# Patient Record
Sex: Male | Born: 1990
Health system: Southern US, Community
[De-identification: ages and names within clinical notes are randomized; demographics above are authoritative.]

## PROBLEM LIST (undated history)

## (undated) DIAGNOSIS — E782 Mixed hyperlipidemia: Secondary | ICD-10-CM

## (undated) DIAGNOSIS — J45909 Unspecified asthma, uncomplicated: Secondary | ICD-10-CM

## (undated) DIAGNOSIS — B360 Pityriasis versicolor: Secondary | ICD-10-CM

## (undated) HISTORY — DX: Pityriasis versicolor: B36.0

## (undated) HISTORY — DX: Unspecified asthma, uncomplicated: J45.909

## (undated) HISTORY — DX: Mixed hyperlipidemia: E78.2

---
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
## 2009-01-20 IMAGING — CR DG HAND COMPLETE 3+V*R*
3 series · 3 of 3 positions shown · non-contrast
Comparison: None

CLINICAL DATA: Hip somewhat today now with right hand pain

RIGHT HAND - COMPLETE 3+ VIEW

[view not recorded (1 of 3)]
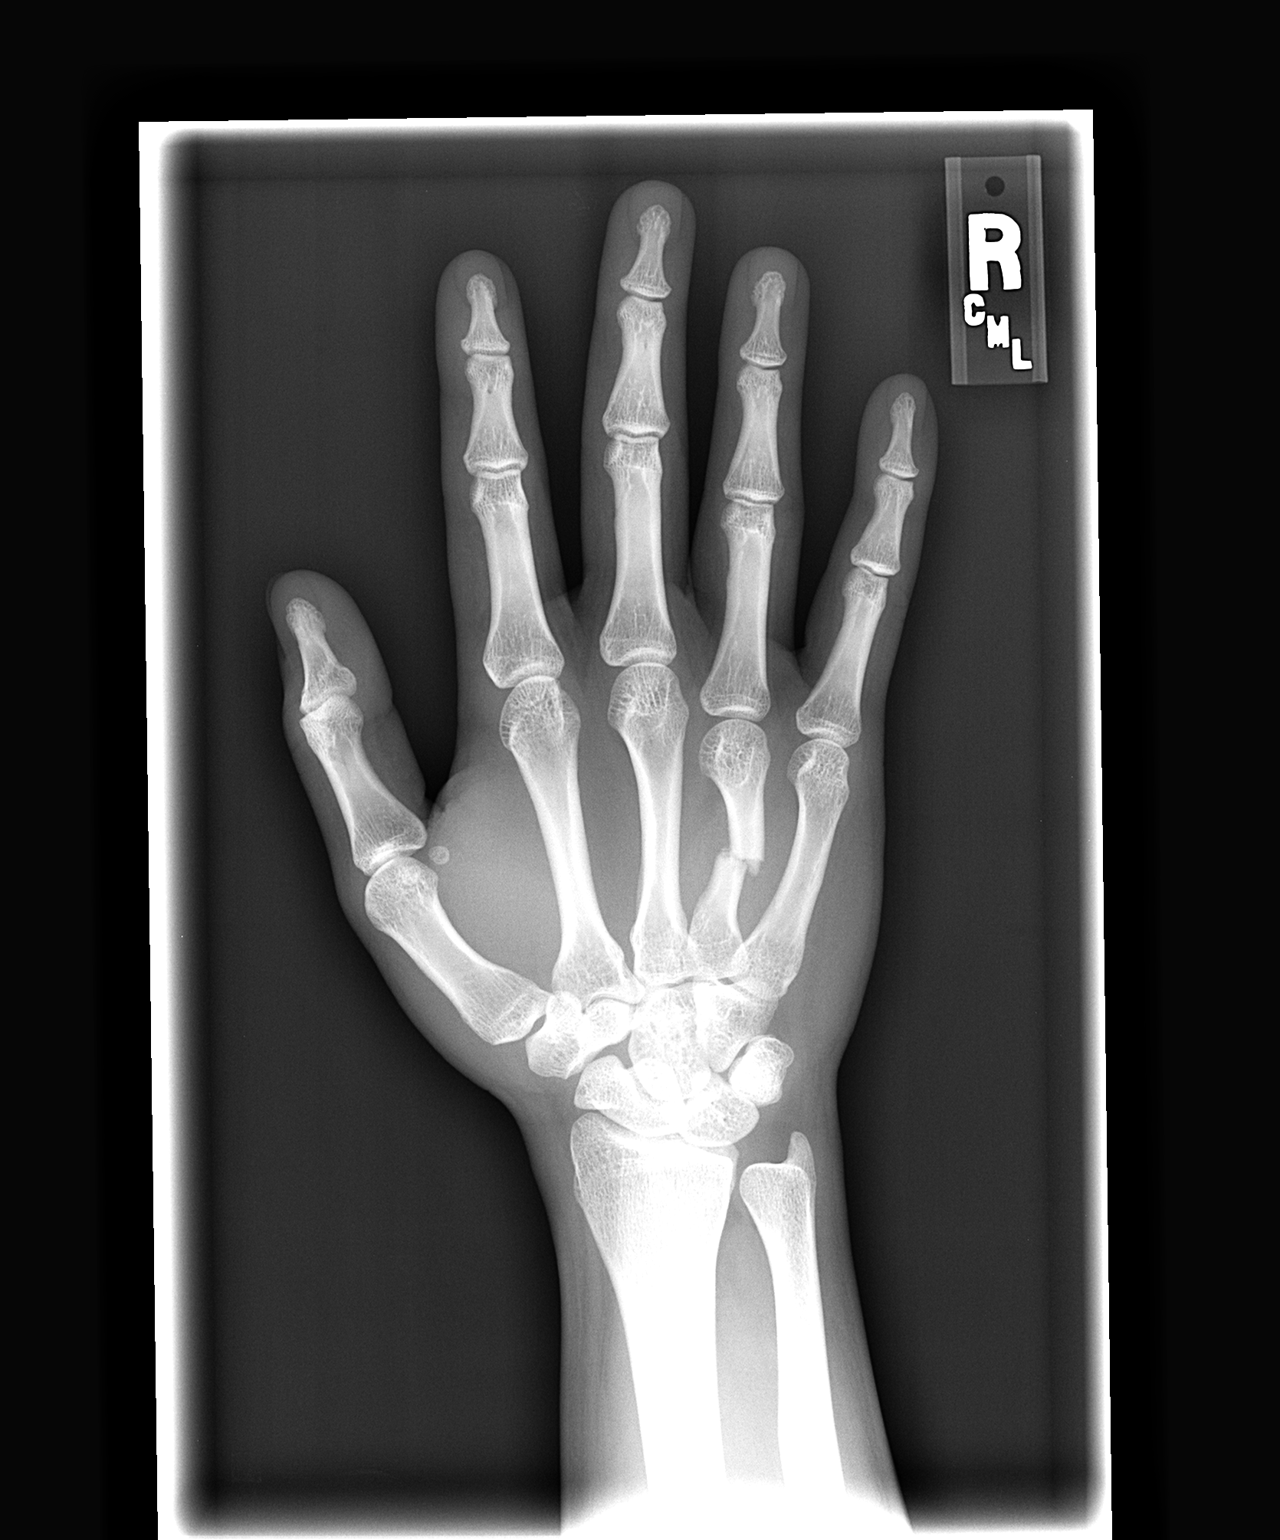

[view not recorded (2 of 3)]
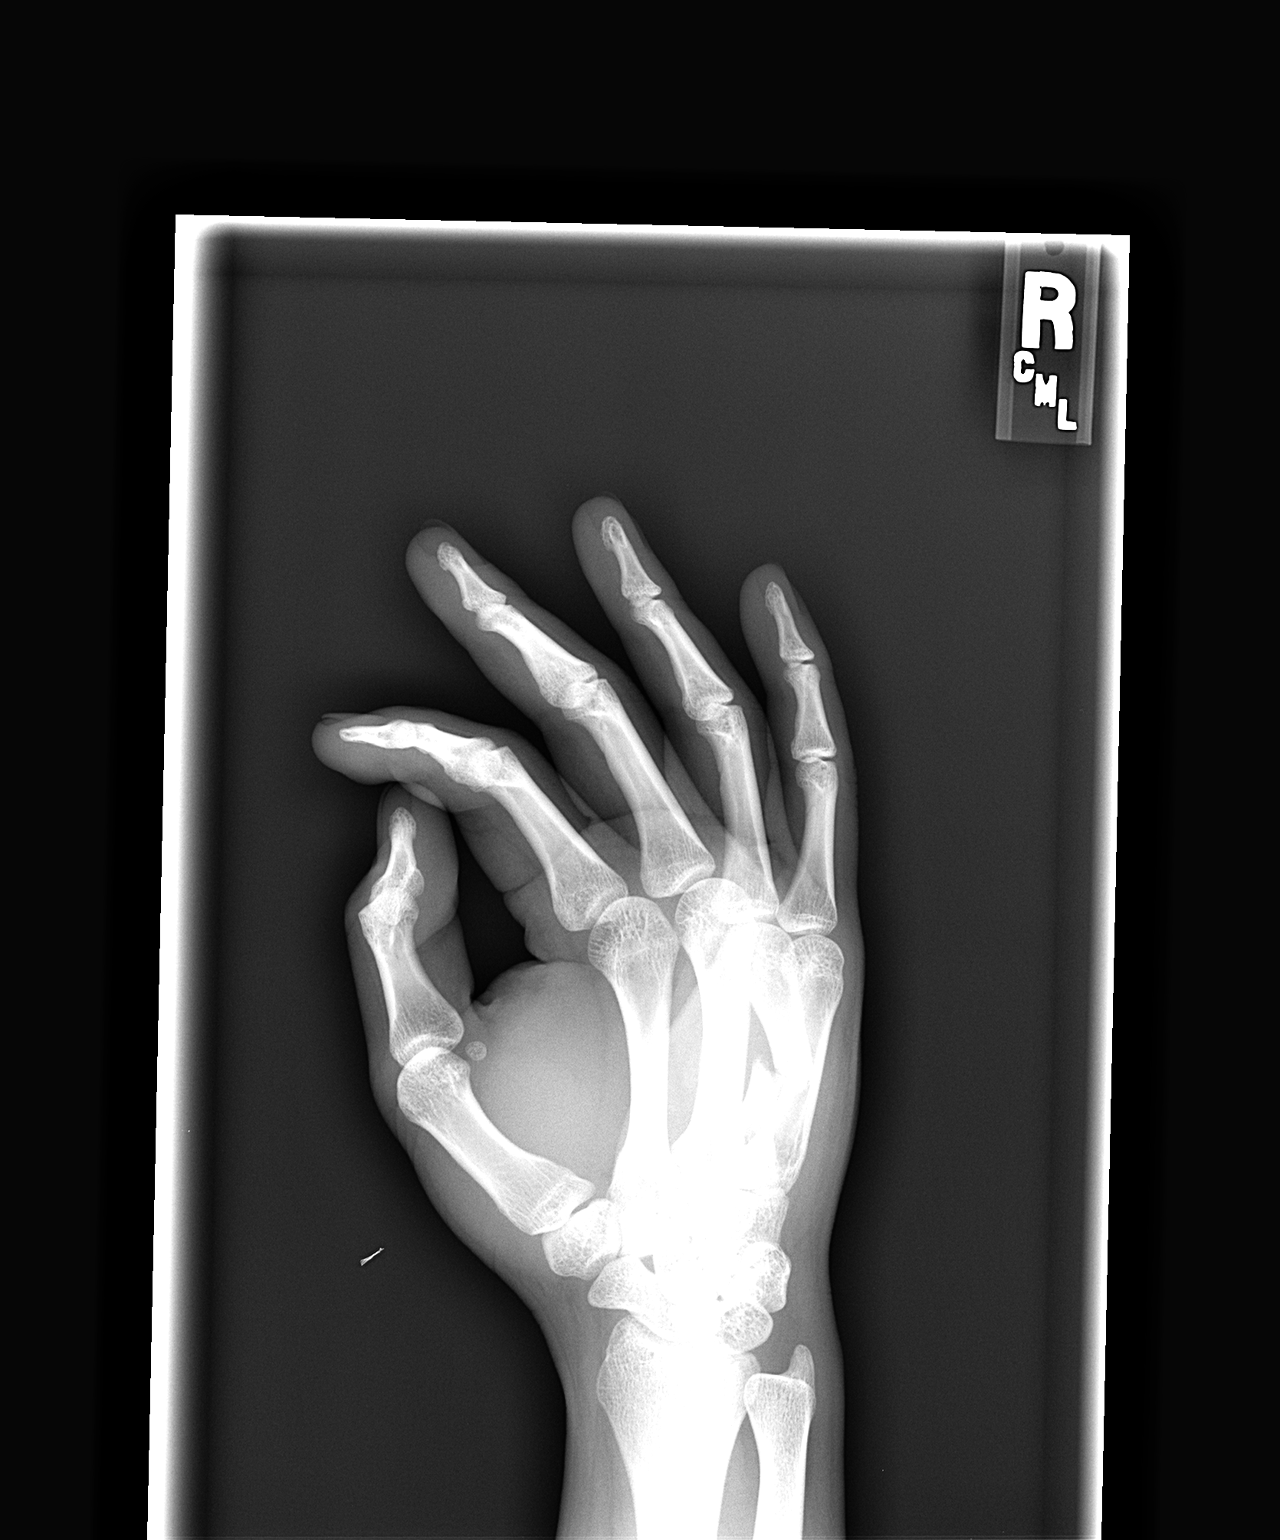

[view not recorded (3 of 3)]
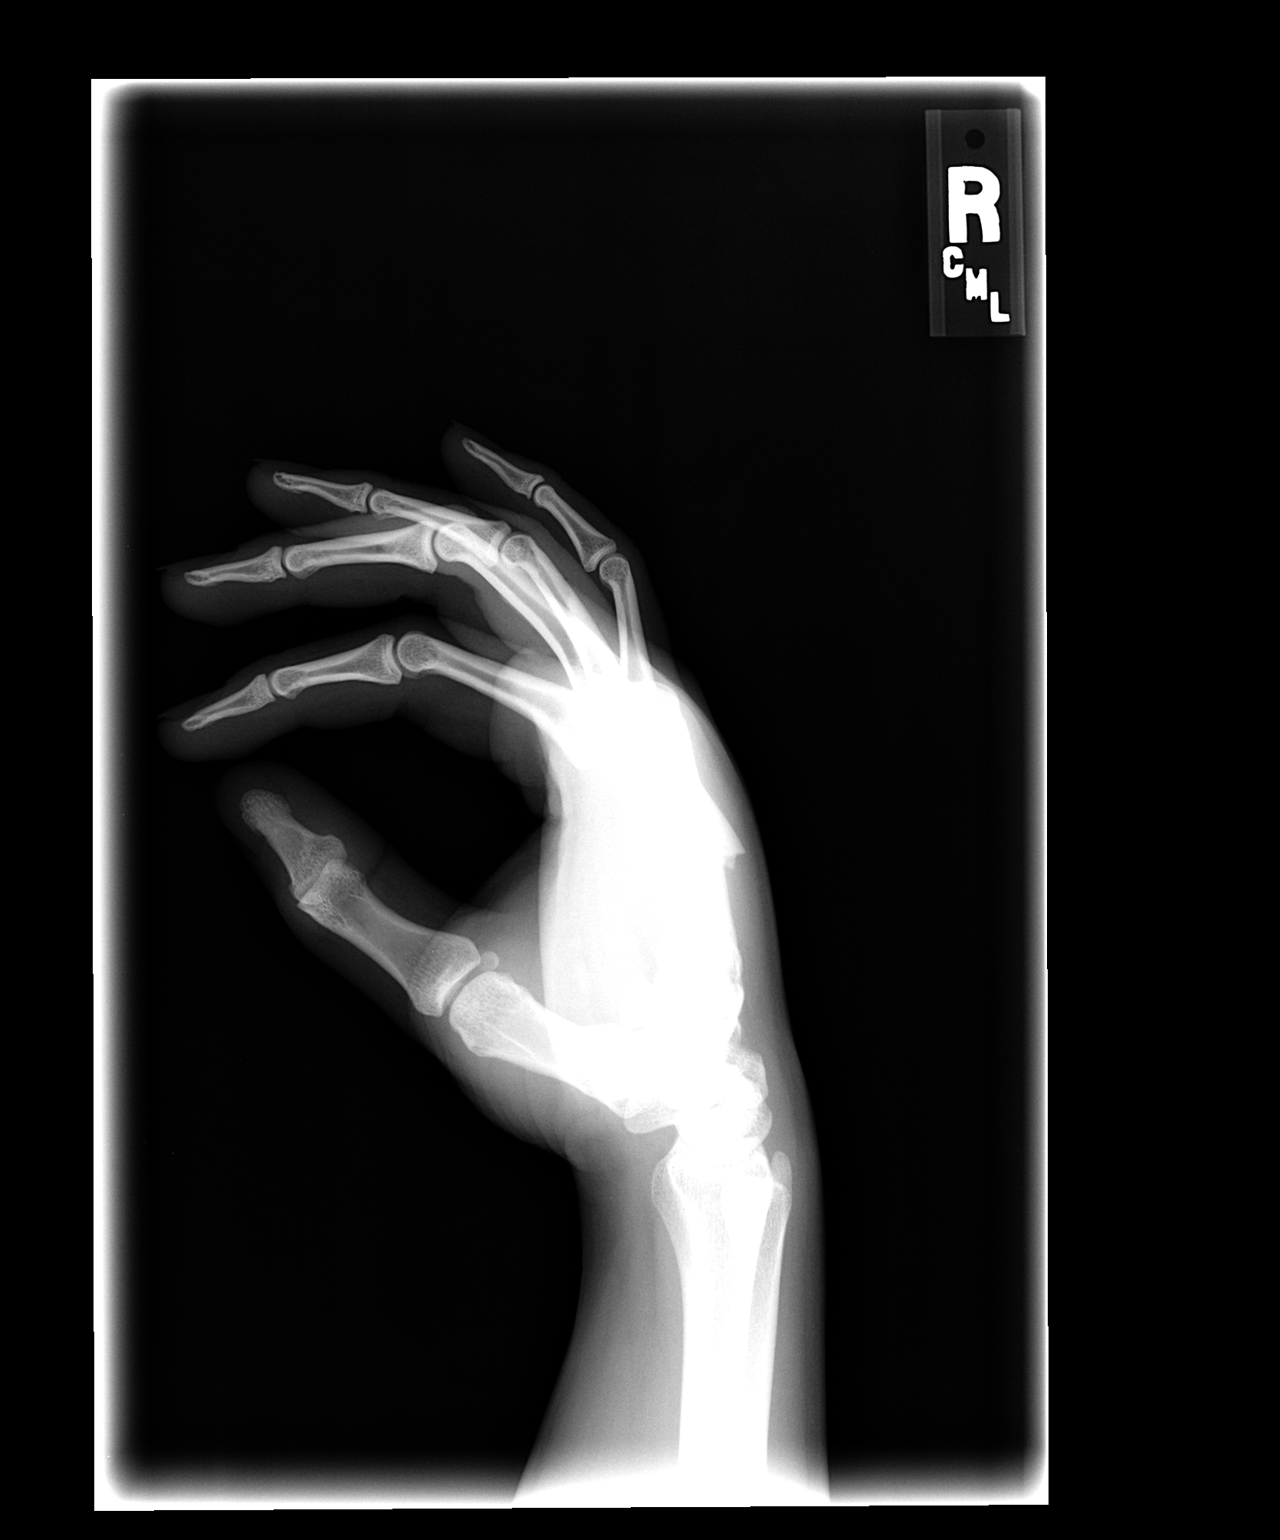

[3 of 3 positions shown; findings below may reference images not displayed]

FINDINGS: There is an angulated and slightly displaced fracture of
the midportion of the right fourth metacarpal with tilting
dorsally.  The radiocarpal joint space appears normal and the
carpal bones are in normal position.  No other acute abnormality is
seen.
IMPRESSION: Dorsally angulated slightly displaced fracture of the mid right
fourth metacarpal

## 2010-05-30 NOTE — Op Note (Signed)
Dean Roberts, Dean Roberts                ACCOUNT NO.:  [id number]   MEDICAL RECORD NO.:  [id number]          PATIENT TYPE:  AMB   LOCATION:  [facility]                          FACILITY:  [facility]   PHYSICIAN:  Dionne Ano. Gramig III, M.D.DATE OF BIRTH:  1990-10-08   DATE OF PROCEDURE:  DATE OF DISCHARGE:                               OPERATIVE REPORT   PREOPERATIVE DIAGNOSIS:  Displaced right ring finger metacarpal  fracture.   POSTOPERATIVE DIAGNOSIS:  Displaced right ring finger metacarpal  fracture.   PROCEDURES:  1. Open reduction internal fixation of right ring finger metacarpal      fracture with intramedullary rodding technique.  2. Stress radiography.   SURGEON:  Dionne Ano. Amanda Pea, MD   ASSISTANT:  None.   COMPLICATIONS:  None.   INDICATIONS FOR PROCEDURE:  This patient is an 20 year old male who  presents for the above-mentioned diagnosis.  I have counseled regarding  risks, benefits of surgery including risk of infection, bleeding,  anesthesia, damage to normal structures, and failure of surgery to  accomplish its intended goals of relieving symptoms and restoring  function.  With this in mind, he desires to proceed.  All questions have  been encouraged and answered preoperatively.   OPERATIVE PROCEDURE:  The patient with Omnicef, anesthesia, taken to the  operative suite.  After smooth induction of anesthesia in form of  general anesthetic, a time-out was called.  Preoperative antibiotics  were given.  He was placed supine, fully padded, prepped and draped in  usual sterile fashion with Betadine scrub and paint.  Following this,  tourniquet was insufflated.  A small transverse incision was made over  the ring finger metacarpal base.  Dissection was carried down.  Pilot  hole was made for intramedullary rod technique, and following this,  0.045 K-wire blunt tip at the end and prebent was placed.  I was able to  reduce the fracture to anatomic parameters and then prebented and  seated  it distally.  It was outside the joints, looked well, and I was pleased  with AP, lateral, and oblique x-rays.  Stress radiography finger splay  was noted to be excellent in terms of the reduction.  I was pleased with  this.  Following this, deflated tourniquet, obtained hemostasis with  bipolar cautery, placed 10 mL of Sensorcaine without epinephrine in the  wound for postop analgesia, and closed with interrupted Prolene.  The  patient tolerated this well.  He was placed in a sterile dressing.  He  will return to the office to see me in 10 days for  AP and lateral x-rays and cast application.  A 5 to 6 week healing time  will be anticipated followed by therapeutic endeavors.  I have discussed  the relevant do's and dont's, etc.  All questions have been encouraged  and answered.  He was discharged home on Vicodin p.r.n. pain.      Dionne Ano. Everlene Other, M.D.     Nash Mantis  D:  08/08/2007  T:  08/09/2007  Job:  [id number]

## 2012-12-22 ENCOUNTER — Telehealth: Payer: Self-pay | Admitting: Family Medicine

## 2012-12-22 NOTE — Telephone Encounter (Signed)
This patient is 33, new and a self pay patient.  Could he please get his cpe on his new patient appointment?  He was scheduled for his new patient appointment this week and was moved to January  For his new patient appointment

## 2012-12-22 NOTE — Telephone Encounter (Signed)
Sure I just cannot promise he will not need a follow up if he has a complicated set of issues

## 2012-12-23 ENCOUNTER — Ambulatory Visit: Payer: Self-pay | Admitting: Family Medicine

## 2012-12-24 ENCOUNTER — Ambulatory Visit: Payer: Self-pay | Admitting: Family Medicine

## 2013-01-22 ENCOUNTER — Encounter: Payer: Self-pay | Admitting: Family Medicine

## 2013-01-22 ENCOUNTER — Ambulatory Visit (INDEPENDENT_AMBULATORY_CARE_PROVIDER_SITE_OTHER): Payer: 59 | Admitting: Family Medicine

## 2013-01-22 VITALS — BP 100/70 | HR 65 | Temp 98.5°F | Ht 66.0 in | Wt 151.1 lb

## 2013-01-22 DIAGNOSIS — Z0000 Encounter for general adult medical examination without abnormal findings: Secondary | ICD-10-CM

## 2013-01-22 DIAGNOSIS — J45909 Unspecified asthma, uncomplicated: Secondary | ICD-10-CM

## 2013-01-22 DIAGNOSIS — B360 Pityriasis versicolor: Secondary | ICD-10-CM

## 2013-01-22 LAB — RENAL FUNCTION PANEL
Albumin: 4.8 g/dL (ref 3.5–5.2)
BUN: 10 mg/dL (ref 6–23)
CALCIUM: 9.7 mg/dL (ref 8.4–10.5)
CO2: 30 meq/L (ref 19–32)
Chloride: 102 mEq/L (ref 96–112)
Creat: 1.05 mg/dL (ref 0.50–1.35)
GLUCOSE: 82 mg/dL (ref 70–99)
POTASSIUM: 4.4 meq/L (ref 3.5–5.3)
Phosphorus: 3.4 mg/dL (ref 2.3–4.6)
SODIUM: 140 meq/L (ref 135–145)

## 2013-01-22 LAB — HEPATIC FUNCTION PANEL
ALT: 11 U/L (ref 0–53)
AST: 13 U/L (ref 0–37)
Albumin: 4.8 g/dL (ref 3.5–5.2)
Alkaline Phosphatase: 21 U/L — ABNORMAL LOW (ref 39–117)
BILIRUBIN DIRECT: 0.1 mg/dL (ref 0.0–0.3)
BILIRUBIN INDIRECT: 0.6 mg/dL (ref 0.0–0.9)
Total Bilirubin: 0.7 mg/dL (ref 0.3–1.2)
Total Protein: 6.9 g/dL (ref 6.0–8.3)

## 2013-01-22 LAB — LIPID PANEL
CHOL/HDL RATIO: 2.8 ratio
CHOLESTEROL: 166 mg/dL (ref 0–200)
HDL: 60 mg/dL (ref >39)
LDL Cholesterol: 93 mg/dL (ref 0–99)
Triglycerides: 64 mg/dL (ref <150)
VLDL: 13 mg/dL (ref 0–40)

## 2013-01-22 LAB — CBC
HCT: 45.4 % (ref 39.0–52.0)
HEMOGLOBIN: 14.1 g/dL (ref 13.0–17.0)
MCH: 22.5 pg — ABNORMAL LOW (ref 26.0–34.0)
MCHC: 31.1 g/dL (ref 30.0–36.0)
MCV: 72.5 fL — ABNORMAL LOW (ref 78.0–100.0)
Platelets: 341 10*3/uL (ref 150–400)
RBC: 6.26 MIL/uL — AB (ref 4.22–5.81)
RDW: 14.1 % (ref 11.5–15.5)
WBC: 6 10*3/uL (ref 4.0–10.5)

## 2013-01-22 MED ORDER — KETOCONAZOLE 2 % EX SHAM: 1.0000 "application " | MEDICATED_SHAMPOO | CUTANEOUS | Status: DC

## 2013-01-22 NOTE — Progress Notes (Signed)
Pre visit review using our clinic review tool, if applicable. No additional management support is needed unless otherwise documented below in the visit note. 

## 2013-01-22 NOTE — Patient Instructions (Addendum)
Preventive Care for Adults, Male A healthy lifestyle and preventive care can promote health and wellness. Preventive health guidelines for men include the following key practices:  A routine yearly physical is a good way to check with your caregiver about your health and preventative screening. It is a chance to share any concerns and updates on your health, and to receive a thorough exam.  Visit your dentist for a routine exam and preventative care every 6 months. Brush your teeth twice a day and floss once a day. Good oral hygiene prevents tooth decay and gum disease.  The frequency of eye exams is based on your age, health, family medical history, use of contact lenses, and other factors. Follow your caregiver's recommendations for frequency of eye exams.  Eat a healthy diet. Foods like vegetables, fruits, whole grains, low-fat dairy products, and lean protein foods contain the nutrients you need without too many calories. Decrease your intake of foods high in solid fats, added sugars, and salt. Eat the right amount of calories for you.Get information about a proper diet from your caregiver, if necessary.  Regular physical exercise is one of the most important things you can do for your health. Most adults should get at least 150 minutes of moderate-intensity exercise (any activity that increases your heart rate and causes you to sweat) each week. In addition, most adults need muscle-strengthening exercises on 2 or more days a week.  Maintain a healthy weight. The body mass index (BMI) is a screening tool to identify possible weight problems. It provides an estimate of body fat based on height and weight. Your caregiver can help determine your BMI, and can help you achieve or maintain a healthy weight.For adults 20 years and older:  A BMI below 18.5 is considered underweight.  A BMI of 18.5 to 24.9 is normal.  A BMI of 25 to 29.9 is considered overweight.  A BMI of 30 and above is  considered obese.  Maintain normal blood lipids and cholesterol levels by exercising and minimizing your intake of saturated fat. Eat a balanced diet with plenty of fruit and vegetables. Blood tests for lipids and cholesterol should begin at age 75 and be repeated every 5 years. If your lipid or cholesterol levels are high, you are over 50, or you are a high risk for heart disease, you may need your cholesterol levels checked more frequently.Ongoing high lipid and cholesterol levels should be treated with medicines if diet and exercise are not effective.  If you smoke, find out from your caregiver how to quit. If you do not use tobacco, do not start.  Lung cancer screening is recommended for adults aged 85 80 years who are at high risk for developing lung cancer because of a history of smoking. Yearly low-dose computed tomography (CT) is recommended for people who have at least a 30-pack-year history of smoking and are a current smoker or have quit within the past 15 years. A pack year of smoking is smoking an average of 1 pack of cigarettes a day for 1 year (for example: 1 pack a day for 30 years or 2 packs a day for 15 years). Yearly screening should continue until the smoker has stopped smoking for at least 15 years. Yearly screening should also be stopped for people who develop a health problem that would prevent them from having lung cancer treatment.  If you choose to drink alcohol, do not exceed 2 drinks per day. One drink is considered to be 12 ounces (  355 mL) of beer, 5 ounces (148 mL) of wine, or 1.5 ounces (44 mL) of liquor.  Avoid use of street drugs. Do not share needles with anyone. Ask for help if you need support or instructions about stopping the use of drugs.  High blood pressure causes heart disease and increases the risk of stroke. Your blood pressure should be checked at least every 1 to 2 years. Ongoing high blood pressure should be treated with medicines, if weight loss and  exercise are not effective.  If you are 49 to 24 years old, ask your caregiver if you should take aspirin to prevent heart disease.  Diabetes screening involves taking a blood sample to check your fasting blood sugar level. This should be done once every 3 years, after age 67, if you are within normal weight and without risk factors for diabetes. Testing should be considered at a younger age or be carried out more frequently if you are overweight and have at least 1 risk factor for diabetes.  Colorectal cancer can be detected and often prevented. Most routine colorectal cancer screening begins at the age of 72 and continues through age 25. However, your caregiver may recommend screening at an earlier age if you have risk factors for colon cancer. On a yearly basis, your caregiver may provide home test kits to check for hidden blood in the stool. Use of a small camera at the end of a tube, to directly examine the colon (sigmoidoscopy or colonoscopy), can detect the earliest forms of colorectal cancer. Talk to your caregiver about this at age 23, when routine screening begins. Direct examination of the colon should be repeated every 5 to 10 years through age 10, unless early forms of pre-cancerous polyps or small growths are found.  Hepatitis C blood testing is recommended for all people born from [birth year] through 1965 and any individual with known risks for hepatitis C.  Practice safe sex. Use condoms and avoid high-risk sexual practices to reduce the spread of sexually transmitted infections (STIs). STIs include gonorrhea, chlamydia, syphilis, trichomonas, herpes, HPV, and human immunodeficiency virus (HIV). Herpes, HIV, and HPV are viral illnesses that have no cure. They can result in disability, cancer, and death.  A one-time screening for abdominal aortic aneurysm (AAA) and surgical repair of large AAAs by sound wave imaging (ultrasonography) is recommended for ages 67 to 66 years who are current or  former smokers.  Healthy men should no longer receive prostate-specific antigen (PSA) blood tests as part of routine cancer screening. Consult with your caregiver about prostate cancer screening.  Testicular cancer screening is not recommended for adult males who have no symptoms. Screening includes self-exam, caregiver exam, and other screening tests. Consult with your caregiver about any symptoms you have or any concerns you have about testicular cancer.  Use sunscreen. Apply sunscreen liberally and repeatedly throughout the day. You should seek shade when your shadow is shorter than you. Protect yourself by wearing long sleeves, pants, a wide-brimmed hat, and sunglasses year round, whenever you are outdoors.  Once a month, do a whole body skin exam, using a mirror to look at the skin on your back. Notify your caregiver of new moles, moles that have irregular borders, moles that are larger than a pencil eraser, or moles that have changed in shape or color.  Stay current with required immunizations.  Influenza vaccine. All adults should be immunized every year.  Tetanus, diphtheria, and acellular pertussis (Td, Tdap) vaccine. An adult who has not previously  with required immunizations.  · Influenza vaccine. All adults should be immunized every year.  · Tetanus, diphtheria, and acellular pertussis (Td, Tdap) vaccine. An adult who has not previously received Tdap or who does not know his vaccine status should receive 1 dose of Tdap. This initial dose should be followed by tetanus and diphtheria toxoids (Td) booster doses every 10 years. Adults with an unknown or incomplete history of completing a 3-dose immunization series with Td-containing vaccines should begin or complete a primary immunization series including a Tdap dose. Adults should receive a Td booster every 10 years.  · Varicella vaccine. An adult without evidence of immunity to varicella should receive 2 doses or a second dose if he has previously received 1 dose.  · Human papillomavirus (HPV) vaccine. Males aged 13 21 years who have not received the vaccine previously should receive the 3-dose series. Males aged 22 26 years may be  immunized. Immunization is recommended through the age of 26 years for any male who has sex with males and did not get any or all doses earlier. Immunization is recommended for any person with an immunocompromised condition through the age of 26 years if he did not get any or all doses earlier. During the 3-dose series, the second dose should be obtained 4 8 weeks after the first dose. The third dose should be obtained 24 weeks after the first dose and 16 weeks after the second dose.  · Zoster vaccine. One dose is recommended for adults aged 60 years or older unless certain conditions are present.  · Measles, mumps, and rubella (MMR) vaccine. Adults born before 1957 generally are considered immune to measles and mumps. Adults born in 1957 or later should have 1 or more doses of MMR vaccine unless there is a contraindication to the vaccine or there is laboratory evidence of immunity to each of the three diseases. A routine second dose of MMR vaccine should be obtained at least 28 days after the first dose for students attending postsecondary schools, health care workers, or international travelers. People who received inactivated measles vaccine or an unknown type of measles vaccine during 1963 1967 should receive 2 doses of MMR vaccine. People who received inactivated mumps vaccine or an unknown type of mumps vaccine before 1979 and are at high risk for mumps infection should consider immunization with 2 doses of MMR vaccine. Unvaccinated health care workers born before 1957 who lack laboratory evidence of measles, mumps, or rubella immunity or laboratory confirmation of disease should consider measles and mumps immunization with 2 doses of MMR vaccine or rubella immunization with 1 dose of MMR vaccine.  · Pneumococcal 13-valent conjugate (PCV13) vaccine. When indicated, a person who is uncertain of his immunization history and has no record of immunization should receive the PCV13 vaccine. An adult aged 19 years or  older who has certain medical conditions and has not been previously immunized should receive 1 dose of PCV13 vaccine. This PCV13 should be followed with a dose of pneumococcal polysaccharide (PPSV23) vaccine. The PPSV23 vaccine dose should be obtained at least 8 weeks after the dose of PCV13 vaccine. An adult aged 19 years or older who has certain medical conditions and previously received 1 or more doses of PPSV23 vaccine should receive 1 dose of PCV13. The PCV13 vaccine dose should be obtained 1 or more years after the last PPSV23 vaccine dose.  · Pneumococcal polysaccharide (PPSV23) vaccine. When PCV13 is also indicated, PCV13 should be obtained first. All adults aged 65   An adult smoker should be immunized. People with an immunocompromised condition and certain other conditions should receive both PCV13 and PPSV23 vaccines. People with human immunodeficiency virus (HIV) infection should be immunized as soon as possible after diagnosis. Immunization during chemotherapy or radiation therapy should be avoided. Routine use of PPSV23 vaccine is not recommended for American Indians, [place] Natives, or people younger than 65 years unless there are medical conditions that require PPSV23 vaccine. When indicated, people who have unknown immunization and have no record of immunization should receive PPSV23 vaccine. One-time revaccination 5 years after the first dose of PPSV23 is recommended for people aged 88 64 years who have chronic kidney failure, nephrotic syndrome, asplenia, or immunocompromised conditions. People who received 1 2 doses of PPSV23 before age 37 years should receive another dose of PPSV23 vaccine at age 41 years or later if at least 5 years have passed since the previous dose. Doses of  PPSV23 are not needed for people immunized with PPSV23 at or after age 55 years.  Meningococcal vaccine. Adults with asplenia or persistent complement component deficiencies should receive 2 doses of quadrivalent meningococcal conjugate (MenACWY-D) vaccine. The doses should be obtained at least 2 months apart. Microbiologists working with certain meningococcal bacteria, [employer] recruits, people at risk during an outbreak, and people who travel to or live in countries with a high rate of meningitis should be immunized. A first-year college student up through age 28 years who is living in a residence hall should receive a dose if he did not receive a dose on or after his 16th birthday. Adults who have certain high-risk conditions should receive one or more doses of vaccine.  Hepatitis A vaccine. Adults who wish to be protected from this disease, have certain high-risk conditions, work with hepatitis A-infected animals, work in hepatitis A research labs, or travel to or work in countries with a high rate of hepatitis A should be immunized. Adults who were previously unvaccinated and who anticipate close contact with an international adoptee during the first 60 days after arrival in the [place] from a country with a high rate of hepatitis A should be immunized.  Hepatitis B vaccine. Adults who wish to be protected from this disease, have certain high-risk conditions, may be exposed to blood or other infectious body fluids, are household contacts or sex partners of hepatitis B positive people, are clients or workers in certain care facilities, or travel to or work in countries with a high rate of hepatitis B should be immunized.  Haemophilus influenzae type b (Hib) vaccine. A previously unvaccinated person with asplenia or sickle cell disease or having a scheduled splenectomy should receive 1 dose of Hib vaccine. Regardless of previous immunization, a recipient of a hematopoietic stem cell transplant  should receive a 3-dose series 6 12 months after his successful transplant. Hib vaccine is not recommended for adults with HIV infection. Preventive Service / Frequency Ages 69 to 53  Blood pressure check.** / Every 1 to 2 years.  Lipid and cholesterol check.** / Every 5 years beginning at age 51.  Hepatitis C blood test.** / For any individual with known risks for hepatitis C.  Skin self-exam. / Monthly.  Influenza vaccine. / Every year.  Tetanus, diphtheria, and acellular pertussis (Tdap, Td) vaccine.** / Consult your caregiver. 1 dose of Td every 10 years.  Varicella vaccine.** / Consult your caregiver.  HPV vaccine. / 3 doses over 6 months, if 46 and younger.  Measles, mumps, rubella (MMR) vaccine.** / You  need at least 1 dose of MMR if you were born in 1957 or later. You may also need a 2nd dose.  Pneumococcal 13-valent conjugate (PCV13) vaccine.** / Consult your caregiver.  Pneumococcal polysaccharide (PPSV23) vaccine.** / 1 to 2 doses if you smoke cigarettes or if you have certain conditions.  Meningococcal vaccine.** / 1 dose if you are age 30 to 15 years and a Market researcher living in a residence hall, or have one of several medical conditions, you need to get vaccinated against meningococcal disease. You may also need additional booster doses.  Hepatitis A vaccine.** / Consult your caregiver.  Hepatitis B vaccine.** / Consult your caregiver.  Haemophilus influenzae type b (Hib) vaccine.** / Consult your caregiver. Ages 36 to 12  Blood pressure check.** / Every 1 to 2 years.  Lipid and cholesterol check.** / Every 5 years beginning at age 71.  Lung cancer screening. / Every year if you are aged 55 80 years and have a 30-pack-year history of smoking and currently smoke or have quit within the past 15 years. Yearly screening is stopped once you have quit smoking for at least 15 years or develop a health problem that would prevent you from having lung cancer  treatment.  Fecal occult blood test (FOBT) of stool. / Every year beginning at age 75 and continuing until age 60. You may not have to do this test if you get colonoscopy every 10 years.  Flexible sigmoidoscopy** or colonoscopy.** / Every 5 years for a flexible sigmoidoscopy or every 10 years for a colonoscopy beginning at age 18 and continuing until age 14.  Hepatitis C blood test.** / For all people born from [birth year] through 1965 and any individual with known risks for hepatitis C.  Skin self-exam. / Monthly.  Influenza vaccine. / Every year.  Tetanus, diphtheria, and acellular pertussis (Tdap/Td) vaccine.** / Consult your caregiver. 1 dose of Td every 10 years.  Varicella vaccine.** / Consult your caregiver.  Zoster vaccine.** / 1 dose for adults aged 63 years or older.  Measles, mumps, rubella (MMR) vaccine.** / You need at least 1 dose of MMR if you were born in 1957 or later. You may also need a 2nd dose.  Pneumococcal 13-valent conjugate (PCV13) vaccine.** / Consult your caregiver.  Pneumococcal polysaccharide (PPSV23) vaccine.** / 1 to 2 doses if you smoke cigarettes or if you have certain conditions.  Meningococcal vaccine.** / Consult your caregiver.  Hepatitis A vaccine.** / Consult your caregiver.  Hepatitis B vaccine.** / Consult your caregiver.  Haemophilus influenzae type b (Hib) vaccine.** / Consult your caregiver. Ages 94 and over  Blood pressure check.** / Every 1 to 2 years.  Lipid and cholesterol check.**/ Every 5 years beginning at age 69.  Lung cancer screening. / Every year if you are aged 37 80 years and have a 30-pack-year history of smoking and currently smoke or have quit within the past 15 years. Yearly screening is stopped once you have quit smoking for at least 15 years or develop a health problem that would prevent you from having lung cancer treatment.  Fecal occult blood test (FOBT) of stool. / Every year beginning at age 73 and continuing until  age 56. You may not have to do this test if you get colonoscopy every 10 years.  Flexible sigmoidoscopy** or colonoscopy.** / Every 5 years for a flexible sigmoidoscopy or every 10 years for a colonoscopy beginning at age 40 and continuing until age 43.  Hepatitis C blood test.** /  For all people born from [birth year] through 1966 and any individual with known risks for hepatitis C.  Abdominal aortic aneurysm (AAA) screening.** / A one-time screening for ages 49 to 8 years who are current or former smokers.  Skin self-exam. / Monthly.  Influenza vaccine. / Every year.  Tetanus, diphtheria, and acellular pertussis (Tdap/Td) vaccine.** / 1 dose of Td every 10 years.  Varicella vaccine.** / Consult your caregiver.  Zoster vaccine.** / 1 dose for adults aged 63 years or older.  Pneumococcal 13-valent conjugate (PCV13) vaccine.** / Consult your caregiver.  Pneumococcal polysaccharide (PPSV23) vaccine.** / 1 dose for all adults aged 57 years and older.  Meningococcal vaccine.** / Consult your caregiver.  Hepatitis A vaccine.** / Consult your caregiver.  Hepatitis B vaccine.** / Consult your caregiver.  Haemophilus influenzae type b (Hib) vaccine.** / Consult your caregiver. **Family history and personal history of risk and conditions may change your caregiver's recommendations. Document Released: 02/27/2001 Document Revised: 04/28/2012 Document Reviewed: 05/29/2010 [facility] Patient Information 2014 [place], [place]. Tinea Versicolor Tinea versicolor is a common yeast infection of the skin. This condition becomes known when the yeast on our skin starts to overgrow (yeast is a normal inhabitant on our skin). This condition is noticed as white or light brown patches on brown skin, and is more evident in the summer on tanned skin. These areas are slightly scaly if scratched. The light patches from the yeast become evident when the yeast creates "holes in your suntan". This is most often noticed in  the summer. The patches are usually located on the chest, back, pubis, neck and body folds. However, it may occur on any area of body. Mild itching and inflammation (redness or soreness) may be present. DIAGNOSIS  The diagnosisof this is made clinically (by looking). Cultures from samples are usually not needed. Examination under the microscope may help. However, yeast is normally found on skin. The diagnosis still remains clinical. Examination under [employer] Light can determine the extent of the infection. TREATMENT  This common infection is usually only of cosmetic (only a concern to your appearance). It is easily treated with dandruff shampoo used during showers or bathing. Vigorous scrubbing will eliminate the yeast over several days time. The light areas in your skin may remain for weeks or months after the infection is cured unless your skin is exposed to sunlight. The lighter or darker spots caused by the fungus that remain after complete treatment are not a sign of treatment failure; it will take a long time to resolve. Your caregiver may recommend a number of commercial preparations or medication by mouth if home care is not working. Recurrence is common and preventative medication may be necessary. This skin condition is not highly contagious. Special care is not needed to protect close friends and family members. Normal hygiene is usually enough. Follow up is required only if you develop complications (such as a secondary infection from scratching), if recommended by your caregiver, or if no relief is obtained from the preparations used. Document Released: 12/30/1999 Document Revised: 03/26/2011 Document Reviewed: 02/11/2008 [facility] Patient Information 2014 [place], [place].

## 2013-01-23 ENCOUNTER — Telehealth: Payer: Self-pay | Admitting: Family Medicine

## 2013-01-23 LAB — TSH: TSH: 0.382 u[IU]/mL (ref 0.350–4.500)

## 2013-01-23 NOTE — Telephone Encounter (Signed)
Relevant patient education assigned to patient using Emmi. ° °

## 2013-01-25 ENCOUNTER — Encounter: Payer: Self-pay | Admitting: Family Medicine

## 2013-01-25 DIAGNOSIS — Z0000 Encounter for general adult medical examination without abnormal findings: Secondary | ICD-10-CM | POA: Insufficient documentation

## 2013-01-25 DIAGNOSIS — B360 Pityriasis versicolor: Secondary | ICD-10-CM

## 2013-01-25 HISTORY — DX: Pityriasis versicolor: B36.0

## 2013-01-25 NOTE — Assessment & Plan Note (Signed)
Has not been symptomatic for many years

## 2013-01-25 NOTE — Progress Notes (Signed)
Patient ID: Dean Roberts, male   DOB: 1990/10/29, 23 y.o.   MRN: [id number] Dean Roberts [id number] 1990/10/29 01/25/2013      Progress Note New Patient  Subjective  Chief Complaint  Chief Complaint  Patient presents with  . Establish Care    new patient    HPI  Patient is a 23 year old male in today for new patient exam. He offers no acute complaints. Notes as an infant he was exposed to cocaine injection had respiratory troubles as a youngster but this has completely resolved over the last several years. No recent illness. No chest pain or palpitations. Shortness of breath GI or GU concerns. Does note some skin changes over several years diaphoretic or scaly.  Past Medical History  Diagnosis Date  . Asthma     childhood, exposed to volcanic dust as infant  . Tinea versicolor 01/25/2013    Past Surgical History  Procedure Laterality Date  . Hand surgery  2010    right hand, fractured twice, 5 th carpal with pin in bone    Family History  Problem Relation Age of Onset  . Hypertension Maternal Grandfather   . Cancer Paternal Grandmother     ovarian    History   Social History  . Marital Status: Single    Spouse Name: N/A    Number of Children: N/A  . Years of Education: N/A   Occupational History  . Not on file.   Social History Main Topics  . Smoking status: Current Some Day Smoker  . Smokeless tobacco: Never Used  . Alcohol Use: Yes     Comment: occasionally  . Drug Use: No  . Sexual Activity: Not Currently     Comment: lives with folks, no dietary restrictions, [place] [employer]   Other Topics Concern  . Not on file   Social History Narrative  . No narrative on file    No current outpatient prescriptions on file prior to visit.   No current facility-administered medications on file prior to visit.    No Known Allergies  Review of Systems  Review of Systems  Constitutional: Negative for fever, chills and malaise/fatigue.  HENT:  Negative for congestion, hearing loss and nosebleeds.   Eyes: Negative for discharge.  Respiratory: Negative for cough, sputum production, shortness of breath and wheezing.   Cardiovascular: Negative for chest pain, palpitations and leg swelling.  Gastrointestinal: Negative for heartburn, nausea, vomiting, abdominal pain, diarrhea, constipation and blood in stool.  Genitourinary: Negative for dysuria, urgency, frequency and hematuria.  Musculoskeletal: Negative for back pain, falls and myalgias.  Skin: Negative for rash.  Neurological: Negative for dizziness, tremors, sensory change, focal weakness, loss of consciousness, weakness and headaches.  Endo/Heme/Allergies: Negative for polydipsia. Does not bruise/bleed easily.  Psychiatric/Behavioral: Negative for depression and suicidal ideas. The patient is not nervous/anxious and does not have insomnia.     Objective  BP 100/70  Pulse 65  Temp(Src) 98.5 F (36.9 C) (Oral)  Ht 5\' 6"  (1.676 m)  Wt 151 lb 1.9 oz (68.548 kg)  BMI 24.40 kg/m2  SpO2 98%  Physical Exam  Physical Exam  Constitutional: He is oriented to person, place, and time and well-developed, well-nourished, and in no distress. No distress.  HENT:  Head: Normocephalic and atraumatic.  Eyes: Conjunctivae are normal.  Neck: Neck supple. No thyromegaly present.  Cardiovascular: Normal rate, regular rhythm and normal heart sounds.   No murmur heard. Pulmonary/Chest: Effort normal and breath sounds normal. No respiratory distress.  Abdominal: He exhibits no distension and no mass. There is no tenderness.  Musculoskeletal: He exhibits no edema.  Neurological: He is alert and oriented to person, place, and time.  Skin: Skin is warm. Rash noted.  Trunk has diffuse hypopigmented patches  Psychiatric: Memory, affect and judgment normal.       Assessment & Plan  Asthma Has not been symptomatic for many years  Tinea versicolor Nizoral shampoo to skin as  directed  Preventative health care Annual labs drawn and reviewed, no concerns identified, can repeat in roughly 5 years unless clinical conditions arise. Encouraged heart healthy diet and regular exercise and sleep

## 2013-01-25 NOTE — Assessment & Plan Note (Signed)
Nizoral shampoo to skin as directed

## 2013-01-25 NOTE — Assessment & Plan Note (Signed)
Annual labs drawn and reviewed, no concerns identified, can repeat in roughly 5 years unless clinical conditions arise. Encouraged heart healthy diet and regular exercise and sleep

## 2013-02-05 ENCOUNTER — Encounter: Payer: Self-pay | Admitting: Family Medicine

## 2013-08-31 ENCOUNTER — Telehealth: Payer: Self-pay

## 2013-08-31 NOTE — Telephone Encounter (Signed)
For what? Travel? How many days do we need to cover. Scopalamine patches 1.5 mg aply behind ear change every72 hours. Disp QS

## 2013-08-31 NOTE — Telephone Encounter (Signed)
Patients mother left a message stating that pt needs some motion sickness patch sent to pharmacy?  Please advise?

## 2013-08-31 NOTE — Telephone Encounter (Signed)
Patients mother left a message stating that pt needs a motion sickness patch sent to his pharmacy by the end of the week?  Please advise?

## 2013-09-02 NOTE — Telephone Encounter (Signed)
Left a message on [id number] at 4:19 pm to have mom return our call along with call for her spouse

## 2013-09-08 NOTE — Telephone Encounter (Signed)
Closing note since we never heard from pts mom and they needed this by the end of last week

## 2014-01-22 ENCOUNTER — Encounter: Payer: 59 | Admitting: Family Medicine

## 2014-02-12 ENCOUNTER — Encounter: Payer: Self-pay | Admitting: Family Medicine

## 2014-02-12 ENCOUNTER — Ambulatory Visit (INDEPENDENT_AMBULATORY_CARE_PROVIDER_SITE_OTHER): Payer: 59 | Admitting: Family Medicine

## 2014-02-12 VITALS — BP 116/75 | HR 76 | Temp 98.2°F | Ht 66.0 in | Wt 161.4 lb

## 2014-02-12 DIAGNOSIS — Z0000 Encounter for general adult medical examination without abnormal findings: Secondary | ICD-10-CM

## 2014-02-12 DIAGNOSIS — B360 Pityriasis versicolor: Secondary | ICD-10-CM

## 2014-02-12 DIAGNOSIS — Z23 Encounter for immunization: Secondary | ICD-10-CM

## 2014-02-12 DIAGNOSIS — R7989 Other specified abnormal findings of blood chemistry: Secondary | ICD-10-CM

## 2014-02-12 MED ORDER — CETIRIZINE HCL 10 MG PO TABS: 10.0000 mg | ORAL_TABLET | Freq: Every day | ORAL | Status: AC | PRN

## 2014-02-12 NOTE — Patient Instructions (Signed)
Consider a probiotic daily such as [employer]estive Advantage or [employer] [facility]    Preventive Care for Adults A healthy lifestyle and preventive care can promote health and wellness. Preventive health guidelines for men include the following key practices:  A routine yearly physical is a good way to check with your health care provider about your health and preventative screening. It is a chance to share any concerns and updates on your health and to receive a thorough exam.  Visit your dentist for a routine exam and preventative care every 6 months. Brush your teeth twice a day and floss once a day. Good oral hygiene prevents tooth decay and gum disease.  The frequency of eye exams is based on your age, health, family medical history, use of contact lenses, and other factors. Follow your health care provider's recommendations for frequency of eye exams.  Eat a healthy diet. Foods such as vegetables, fruits, whole grains, low-fat dairy products, and lean protein foods contain the nutrients you need without too many calories. Decrease your intake of foods high in solid fats, added sugars, and salt. Eat the right amount of calories for you.Get information about a proper diet from your health care provider, if necessary.  Regular physical exercise is one of the most important things you can do for your health. Most adults should get at least 150 minutes of moderate-intensity exercise (any activity that increases your heart rate and causes you to sweat) each week. In addition, most adults need muscle-strengthening exercises on 2 or more days a week.  Maintain a healthy weight. The body mass index (BMI) is a screening tool to identify possible weight problems. It provides an estimate of body fat based on height and weight. Your health care provider can find your BMI and can help you achieve or maintain a healthy weight.For adults 20 years and older:  A BMI below 18.5 is considered underweight.  A  BMI of 18.5 to 24.9 is normal.  A BMI of 25 to 29.9 is considered overweight.  A BMI of 30 and above is considered obese.  Maintain normal blood lipids and cholesterol levels by exercising and minimizing your intake of saturated fat. Eat a balanced diet with plenty of fruit and vegetables. Blood tests for lipids and cholesterol should begin at age 20 and be repeated every 5 years. If your lipid or cholesterol levels are high, you are over 50, or you are at high risk for heart disease, you may need your cholesterol levels checked more frequently.Ongoing high lipid and cholesterol levels should be treated with medicines if diet and exercise are not working.  If you smoke, find out from your health care provider how to quit. If you do not use tobacco, do not start.  Lung cancer screening is recommended for adults aged 55-80 years who are at high risk for developing lung cancer because of a history of smoking. A yearly low-dose CT scan of the lungs is recommended for people who have at least a 30-pack-year history of smoking and are a current smoker or have quit within the past 15 years. A pack year of smoking is smoking an average of 1 pack of cigarettes a day for 1 year (for example: 1 pack a day for 30 years or 2 packs a day for 15 years). Yearly screening should continue until the smoker has stopped smoking for at least 15 years. Yearly screening should be stopped for people who develop a health problem that would prevent them from having   lung cancer treatment.  If you choose to drink alcohol, do not have more than 2 drinks per day. One drink is considered to be 12 ounces (355 mL) of beer, 5 ounces (148 mL) of wine, or 1.5 ounces (44 mL) of liquor.  Avoid use of street drugs. Do not share needles with anyone. Ask for help if you need support or instructions about stopping the use of drugs.  High blood pressure causes heart disease and increases the risk of stroke. Your blood pressure should be  checked at least every 1-2 years. Ongoing high blood pressure should be treated with medicines, if weight loss and exercise are not effective.  If you are 45-79 years old, ask your health care provider if you should take aspirin to prevent heart disease.  Diabetes screening involves taking a blood sample to check your fasting blood sugar level. This should be done once every 3 years, after age 45, if you are within normal weight and without risk factors for diabetes. Testing should be considered at a younger age or be carried out more frequently if you are overweight and have at least 1 risk factor for diabetes.  Colorectal cancer can be detected and often prevented. Most routine colorectal cancer screening begins at the age of 50 and continues through age 75. However, your health care provider may recommend screening at an earlier age if you have risk factors for colon cancer. On a yearly basis, your health care provider may provide home test kits to check for hidden blood in the stool. Use of a small camera at the end of a tube to directly examine the colon (sigmoidoscopy or colonoscopy) can detect the earliest forms of colorectal cancer. Talk to your health care provider about this at age 50, when routine screening begins. Direct exam of the colon should be repeated every 5-10 years through age 75, unless early forms of precancerous polyps or small growths are found.  People who are at an increased risk for hepatitis B should be screened for this virus. You are considered at high risk for hepatitis B if:  You were born in a country where hepatitis B occurs often. Talk with your health care provider about which countries are considered high risk.  Your parents were born in a high-risk country and you have not received a shot to protect against hepatitis B (hepatitis B vaccine).  You have HIV or AIDS.  You use needles to inject street drugs.  You live with, or have sex with, someone who has  hepatitis B.  You are a man who has sex with other men (MSM).  You get hemodialysis treatment.  You take certain medicines for conditions such as cancer, organ transplantation, and autoimmune conditions.  Hepatitis C blood testing is recommended for all people born from 1945 through 1965 and any individual with known risks for hepatitis C.  Practice safe sex. Use condoms and avoid high-risk sexual practices to reduce the spread of sexually transmitted infections (STIs). STIs include gonorrhea, chlamydia, syphilis, trichomonas, herpes, HPV, and human immunodeficiency virus (HIV). Herpes, HIV, and HPV are viral illnesses that have no cure. They can result in disability, cancer, and death.  If you are at risk of being infected with HIV, it is recommended that you take a prescription medicine daily to prevent HIV infection. This is called preexposure prophylaxis (PrEP). You are considered at risk if:  You are a man who has sex with other men (MSM) and have other risk factors.  You   are a heterosexual man, are sexually active, and are at increased risk for HIV infection.  You take drugs by injection.  You are sexually active with a partner who has HIV.  Talk with your health care provider about whether you are at high risk of being infected with HIV. If you choose to begin PrEP, you should first be tested for HIV. You should then be tested every 3 months for as long as you are taking PrEP.  A one-time screening for abdominal aortic aneurysm (AAA) and surgical repair of large AAAs by ultrasound are recommended for men ages 65 to 75 years who are current or former smokers.  Healthy men should no longer receive prostate-specific antigen (PSA) blood tests as part of routine cancer screening. Talk with your health care provider about prostate cancer screening.  Testicular cancer screening is not recommended for adult males who have no symptoms. Screening includes self-exam, a health care provider  exam, and other screening tests. Consult with your health care provider about any symptoms you have or any concerns you have about testicular cancer.  Use sunscreen. Apply sunscreen liberally and repeatedly throughout the day. You should seek shade when your shadow is shorter than you. Protect yourself by wearing long sleeves, pants, a wide-brimmed hat, and sunglasses year round, whenever you are outdoors.  Once a month, do a whole-body skin exam, using a mirror to look at the skin on your back. Tell your health care provider about new moles, moles that have irregular borders, moles that are larger than a pencil eraser, or moles that have changed in shape or color.  Stay current with required vaccines (immunizations).  Influenza vaccine. All adults should be immunized every year.  Tetanus, diphtheria, and acellular pertussis (Td, Tdap) vaccine. An adult who has not previously received Tdap or who does not know his vaccine status should receive 1 dose of Tdap. This initial dose should be followed by tetanus and diphtheria toxoids (Td) booster doses every 10 years. Adults with an unknown or incomplete history of completing a 3-dose immunization series with Td-containing vaccines should begin or complete a primary immunization series including a Tdap dose. Adults should receive a Td booster every 10 years.  Varicella vaccine. An adult without evidence of immunity to varicella should receive 2 doses or a second dose if he has previously received 1 dose.  Human papillomavirus (HPV) vaccine. Males aged 13-21 years who have not received the vaccine previously should receive the 3-dose series. Males aged 22-26 years may be immunized. Immunization is recommended through the age of 26 years for any male who has sex with males and did not get any or all doses earlier. Immunization is recommended for any person with an immunocompromised condition through the age of 26 years if he did not get any or all doses  earlier. During the 3-dose series, the second dose should be obtained 4-8 weeks after the first dose. The third dose should be obtained 24 weeks after the first dose and 16 weeks after the second dose.  Zoster vaccine. One dose is recommended for adults aged 60 years or older unless certain conditions are present.  Measles, mumps, and rubella (MMR) vaccine. Adults born before 1957 generally are considered immune to measles and mumps. Adults born in 1957 or later should have 1 or more doses of MMR vaccine unless there is a contraindication to the vaccine or there is laboratory evidence of immunity to each of the three diseases. A routine second dose of MMR   vaccine should be obtained at least 28 days after the first dose for students attending postsecondary schools, health care workers, or international travelers. People who received inactivated measles vaccine or an unknown type of measles vaccine during 1963-1967 should receive 2 doses of MMR vaccine. People who received inactivated mumps vaccine or an unknown type of mumps vaccine before 1979 and are at high risk for mumps infection should consider immunization with 2 doses of MMR vaccine. Unvaccinated health care workers born before 1957 who lack laboratory evidence of measles, mumps, or rubella immunity or laboratory confirmation of disease should consider measles and mumps immunization with 2 doses of MMR vaccine or rubella immunization with 1 dose of MMR vaccine.  Pneumococcal 13-valent conjugate (PCV13) vaccine. When indicated, a person who is uncertain of his immunization history and has no record of immunization should receive the PCV13 vaccine. An adult aged 19 years or older who has certain medical conditions and has not been previously immunized should receive 1 dose of PCV13 vaccine. This PCV13 should be followed with a dose of pneumococcal polysaccharide (PPSV23) vaccine. The PPSV23 vaccine dose should be obtained at least 8 weeks after the dose  of PCV13 vaccine. An adult aged 19 years or older who has certain medical conditions and previously received 1 or more doses of PPSV23 vaccine should receive 1 dose of PCV13. The PCV13 vaccine dose should be obtained 1 or more years after the last PPSV23 vaccine dose.  Pneumococcal polysaccharide (PPSV23) vaccine. When PCV13 is also indicated, PCV13 should be obtained first. All adults aged 65 years and older should be immunized. An adult younger than age 65 years who has certain medical conditions should be immunized. Any person who resides in a nursing home or long-term care facility should be immunized. An adult smoker should be immunized. People with an immunocompromised condition and certain other conditions should receive both PCV13 and PPSV23 vaccines. People with human immunodeficiency virus (HIV) infection should be immunized as soon as possible after diagnosis. Immunization during chemotherapy or radiation therapy should be avoided. Routine use of PPSV23 vaccine is not recommended for American Indians, [place] Natives, or people younger than 65 years unless there are medical conditions that require PPSV23 vaccine. When indicated, people who have unknown immunization and have no record of immunization should receive PPSV23 vaccine. One-time revaccination 5 years after the first dose of PPSV23 is recommended for people aged 19-64 years who have chronic kidney failure, nephrotic syndrome, asplenia, or immunocompromised conditions. People who received 1-2 doses of PPSV23 before age 65 years should receive another dose of PPSV23 vaccine at age 65 years or later if at least 5 years have passed since the previous dose. Doses of PPSV23 are not needed for people immunized with PPSV23 at or after age 65 years.  Meningococcal vaccine. Adults with asplenia or persistent complement component deficiencies should receive 2 doses of quadrivalent meningococcal conjugate (MenACWY-D) vaccine. The doses should be obtained  at least 2 months apart. Microbiologists working with certain meningococcal bacteria, military recruits, people at risk during an outbreak, and people who travel to or live in countries with a high rate of meningitis should be immunized. A first-year college student up through age 21 years who is living in a residence hall should receive a dose if he did not receive a dose on or after his 16th birthday. Adults who have certain high-risk conditions should receive one or more doses of vaccine.  Hepatitis A vaccine. Adults who wish to be protected from this disease,   have certain high-risk conditions, work with hepatitis A-infected animals, work in hepatitis A research labs, or travel to or work in countries with a high rate of hepatitis A should be immunized. Adults who were previously unvaccinated and who anticipate close contact with an international adoptee during the first 60 days after arrival in the [place] from a country with a high rate of hepatitis A should be immunized.  Hepatitis B vaccine. Adults should be immunized if they wish to be protected from this disease, have certain high-risk conditions, may be exposed to blood or other infectious body fluids, are household contacts or sex partners of hepatitis B positive people, are clients or workers in certain care facilities, or travel to or work in countries with a high rate of hepatitis B.  Haemophilus influenzae type b (Hib) vaccine. A previously unvaccinated person with asplenia or sickle cell disease or having a scheduled splenectomy should receive 1 dose of Hib vaccine. Regardless of previous immunization, a recipient of a hematopoietic stem cell transplant should receive a 3-dose series 6-12 months after his successful transplant. Hib vaccine is not recommended for adults with HIV infection. Preventive Service / Frequency Ages 19 to 39  Blood pressure check.** / Every 1 to 2 years.  Lipid and cholesterol check.** / Every 5 years  beginning at age 20.  Hepatitis C blood test.** / For any individual with known risks for hepatitis C.  Skin self-exam. / Monthly.  Influenza vaccine. / Every year.  Tetanus, diphtheria, and acellular pertussis (Tdap, Td) vaccine.** / Consult your health care provider. 1 dose of Td every 10 years.  Varicella vaccine.** / Consult your health care provider.  HPV vaccine. / 3 doses over 6 months, if 26 or younger.  Measles, mumps, rubella (MMR) vaccine.** / You need at least 1 dose of MMR if you were born in 1957 or later. You may also need a second dose.  Pneumococcal 13-valent conjugate (PCV13) vaccine.** / Consult your health care provider.  Pneumococcal polysaccharide (PPSV23) vaccine.** / 1 to 2 doses if you smoke cigarettes or if you have certain conditions.  Meningococcal vaccine.** / 1 dose if you are age 19 to 21 years and a first-year college student living in a residence hall, or have one of several medical conditions. You may also need additional booster doses.  Hepatitis A vaccine.** / Consult your health care provider.  Hepatitis B vaccine.** / Consult your health care provider.  Haemophilus influenzae type b (Hib) vaccine.** / Consult your health care provider. Ages 40 to 64  Blood pressure check.** / Every 1 to 2 years.  Lipid and cholesterol check.** / Every 5 years beginning at age 20.  Lung cancer screening. / Every year if you are aged 55-80 years and have a 30-pack-year history of smoking and currently smoke or have quit within the past 15 years. Yearly screening is stopped once you have quit smoking for at least 15 years or develop a health problem that would prevent you from having lung cancer treatment.  Fecal occult blood test (FOBT) of stool. / Every year beginning at age 50 and continuing until age 75. You may not have to do this test if you get a colonoscopy every 10 years.  Flexible sigmoidoscopy** or colonoscopy.** / Every 5 years for a flexible  sigmoidoscopy or every 10 years for a colonoscopy beginning at age 50 and continuing until age 75.  Hepatitis C blood test.** / For all people born from 1945 through 1965 and any individual   with known risks for hepatitis C.  Skin self-exam. / Monthly.  Influenza vaccine. / Every year.  Tetanus, diphtheria, and acellular pertussis (Tdap/Td) vaccine.** / Consult your health care provider. 1 dose of Td every 10 years.  Varicella vaccine.** / Consult your health care provider.  Zoster vaccine.** / 1 dose for adults aged 27 years or older.  Measles, mumps, rubella (MMR) vaccine.** / You need at least 1 dose of MMR if you were born in 1957 or later. You may also need a second dose.  Pneumococcal 13-valent conjugate (PCV13) vaccine.** / Consult your health care provider.  Pneumococcal polysaccharide (PPSV23) vaccine.** / 1 to 2 doses if you smoke cigarettes or if you have certain conditions.  Meningococcal vaccine.** / Consult your health care provider.  Hepatitis A vaccine.** / Consult your health care provider.  Hepatitis B vaccine.** / Consult your health care provider.  Haemophilus influenzae type b (Hib) vaccine.** / Consult your health care provider. Ages 43 and over  Blood pressure check.** / Every 1 to 2 years.  Lipid and cholesterol check.**/ Every 5 years beginning at age 56.  Lung cancer screening. / Every year if you are aged 52-80 years and have a 30-pack-year history of smoking and currently smoke or have quit within the past 15 years. Yearly screening is stopped once you have quit smoking for at least 15 years or develop a health problem that would prevent you from having lung cancer treatment.  Fecal occult blood test (FOBT) of stool. / Every year beginning at age 29 and continuing until age 31. You may not have to do this test if you get a colonoscopy every 10 years.  Flexible sigmoidoscopy** or colonoscopy.** / Every 5 years for a flexible sigmoidoscopy or every 10  years for a colonoscopy beginning at age 46 and continuing until age 46.  Hepatitis C blood test.** / For all people born from [birth year] through 1965 and any individual with known risks for hepatitis C.  Abdominal aortic aneurysm (AAA) screening.** / A one-time screening for ages 20 to 53 years who are current or former smokers.  Skin self-exam. / Monthly.  Influenza vaccine. / Every year.  Tetanus, diphtheria, and acellular pertussis (Tdap/Td) vaccine.** / 1 dose of Td every 10 years.  Varicella vaccine.** / Consult your health care provider.  Zoster vaccine.** / 1 dose for adults aged 36 years or older.  Pneumococcal 13-valent conjugate (PCV13) vaccine.** / Consult your health care provider.  Pneumococcal polysaccharide (PPSV23) vaccine.** / 1 dose for all adults aged 29 years and older.  Meningococcal vaccine.** / Consult your health care provider.  Hepatitis A vaccine.** / Consult your health care provider.  Hepatitis B vaccine.** / Consult your health care provider.  Haemophilus influenzae type b (Hib) vaccine.** / Consult your health care provider. **Family history and personal history of risk and conditions may change your health care provider's recommendations. Document Released: 02/27/2001 Document Revised: 01/06/2013 Document Reviewed: 05/29/2010 [facility] Patient Information 2015 [place], [place]. This information is not intended to replace advice given to you by your health care provider. Make sure you discuss any questions you have with your health care provider.

## 2014-02-12 NOTE — Progress Notes (Signed)
Pre visit review using our clinic review tool, if applicable. No additional management support is needed unless otherwise documented below in the visit note. 

## 2014-02-21 ENCOUNTER — Encounter: Payer: Self-pay | Admitting: Family Medicine

## 2014-02-21 NOTE — Progress Notes (Signed)
Dean Roberts  [id number] 08-30-1990 02/21/2014      Progress Note-Follow Up  Subjective  Chief Complaint  Chief Complaint  Patient presents with  . Annual Exam    non-fasting    HPI  Patient is a 24 y.o. male in today for routine medical care. Doing very well. No recent illness. His skin has been asymptomatic but he uses his Ketoconazole infrequently. No other concerns. Denies CP/palp/SOB/HA/congestion/fevers/GI or GU c/o. Taking meds as prescribed  Past Medical History  Diagnosis Date  . Asthma     childhood, exposed to volcanic dust as infant  . Tinea versicolor 01/25/2013    Past Surgical History  Procedure Laterality Date  . Hand surgery  2010    right hand, fractured twice, 5 th carpal with pin in bone    Family History  Problem Relation Age of Onset  . Hypertension Maternal Grandfather   . Cancer Paternal Grandmother     ovarian    History   Social History  . Marital Status: Single    Spouse Name: N/A    Number of Children: N/A  . Years of Education: N/A   Occupational History  . Not on file.   Social History Main Topics  . Smoking status: Current Some Day Smoker  . Smokeless tobacco: Never Used  . Alcohol Use: Yes     Comment: occasionally  . Drug Use: No  . Sexual Activity: Not Currently     Comment: lives with folks, no dietary restrictions, works at [employer]   Other Topics Concern  . Not on file   Social History Narrative    Current Outpatient Prescriptions on File Prior to Visit  Medication Sig Dispense Refill  . ketoconazole (NIZORAL) 2 % shampoo Apply 1 application topically once a week. Weekly prn rash 120 mL 2   No current facility-administered medications on file prior to visit.    No Known Allergies  Review of Systems  Review of Systems  Constitutional: Negative for fever, chills and malaise/fatigue.  HENT: Negative for congestion, hearing loss and nosebleeds.   Eyes: Negative for discharge.  Respiratory:  Negative for cough, sputum production, shortness of breath and wheezing.   Cardiovascular: Negative for chest pain, palpitations and leg swelling.  Gastrointestinal: Negative for heartburn, nausea, vomiting, abdominal pain, diarrhea, constipation and blood in stool.  Genitourinary: Negative for dysuria, urgency, frequency and hematuria.  Musculoskeletal: Negative for myalgias, back pain and falls.  Skin: Negative for rash.  Neurological: Negative for dizziness, tremors, sensory change, focal weakness, loss of consciousness, weakness and headaches.  Endo/Heme/Allergies: Negative for polydipsia. Does not bruise/bleed easily.  Psychiatric/Behavioral: Negative for depression and suicidal ideas. The patient is not nervous/anxious and does not have insomnia.     Objective  BP 116/75 mmHg  Pulse 76  Temp(Src) 98.2 F (36.8 C) (Oral)  Ht  (1.676 m)  Wt 161 lb 6.4 oz (73.211 kg)  BMI 26.06 kg/m2  SpO2 98%  Physical Exam  Physical Exam  Constitutional: He is oriented to person, place, and time and well-developed, well-nourished, and in no distress. No distress.  HENT:  Head: Normocephalic and atraumatic.  Eyes: Conjunctivae are normal.  Neck: Neck supple. No thyromegaly present.  Cardiovascular: Normal rate, regular rhythm and normal heart sounds.   No murmur heard. Pulmonary/Chest: Effort normal and breath sounds normal. No respiratory distress.  Abdominal: He exhibits no distension and no mass. There is no tenderness.  Musculoskeletal: He exhibits no edema.  Neurological: He is alert  and oriented to person, place, and time.  Skin: Skin is warm. Rash noted.  Mild discoloration of skin over lower back, no raised, scaly or dark lesions  Psychiatric: Memory, affect and judgment normal.    Lab Results  Component Value Date   TSH 0.382 01/22/2013   Lab Results  Component Value Date   WBC 6.0 01/22/2013   HGB 14.1 01/22/2013   HCT 45.4 01/22/2013   MCV 72.5* 01/22/2013   PLT  341 01/22/2013   Lab Results  Component Value Date   CREATININE 1.05 01/22/2013   BUN 10 01/22/2013   NA 140 01/22/2013   K 4.4 01/22/2013   CL 102 01/22/2013   CO2 30 01/22/2013   Lab Results  Component Value Date   ALT 11 01/22/2013   AST 13 01/22/2013   ALKPHOS 21* 01/22/2013   BILITOT 0.7 01/22/2013   Lab Results  Component Value Date   CHOL 166 01/22/2013   Lab Results  Component Value Date   HDL 60 01/22/2013   Lab Results  Component Value Date   LDLCALC 93 01/22/2013   Lab Results  Component Value Date   TRIG 64 01/22/2013   Lab Results  Component Value Date   CHOLHDL 2.8 01/22/2013     Assessment & Plan  Tinea versicolor No acute lesions just some residual discoloration of skin on back   Preventative health care Patient encouraged to maintain heart healthy diet, regular exercise, adequate sleep. Consider daily probiotics. Take medications as prescribed. No recent illness. Reviewed 2015 labs, does not need new labs

## 2014-02-21 NOTE — Assessment & Plan Note (Signed)
No acute lesions just some residual discoloration of skin on back

## 2014-02-21 NOTE — Assessment & Plan Note (Signed)
Patient encouraged to maintain heart healthy diet, regular exercise, adequate sleep. Consider daily probiotics. Take medications as prescribed. No recent illness. Reviewed 2015 labs, does not need new labs

## 2015-02-15 ENCOUNTER — Encounter: Payer: Self-pay | Admitting: Family Medicine

## 2015-02-15 ENCOUNTER — Ambulatory Visit (INDEPENDENT_AMBULATORY_CARE_PROVIDER_SITE_OTHER): Payer: 59 | Admitting: Family Medicine

## 2015-02-15 VITALS — BP 113/66 | HR 62 | Temp 98.2°F | Ht 66.0 in | Wt 160.2 lb

## 2015-02-15 DIAGNOSIS — Z0000 Encounter for general adult medical examination without abnormal findings: Secondary | ICD-10-CM | POA: Diagnosis not present

## 2015-02-15 DIAGNOSIS — E782 Mixed hyperlipidemia: Secondary | ICD-10-CM

## 2015-02-15 DIAGNOSIS — Z23 Encounter for immunization: Secondary | ICD-10-CM | POA: Diagnosis not present

## 2015-02-15 DIAGNOSIS — R350 Frequency of micturition: Secondary | ICD-10-CM

## 2015-02-15 LAB — TSH: TSH: 0.93 u[IU]/mL (ref 0.35–4.50)

## 2015-02-15 LAB — LIPID PANEL
CHOLESTEROL: 169 mg/dL (ref 0–200)
HDL: 47.7 mg/dL (ref >39.00)
LDL Cholesterol: 89 mg/dL (ref 0–99)
NonHDL: 121.39
TRIGLYCERIDES: 161 mg/dL — AB (ref 0.0–149.0)
Total CHOL/HDL Ratio: 4
VLDL: 32.2 mg/dL (ref 0.0–40.0)

## 2015-02-15 LAB — URINALYSIS
Bilirubin Urine: NEGATIVE
Hgb urine dipstick: NEGATIVE
Ketones, ur: NEGATIVE
LEUKOCYTES UA: NEGATIVE
Nitrite: NEGATIVE
Total Protein, Urine: NEGATIVE
UROBILINOGEN UA: 0.2 (ref 0.0–1.0)
Urine Glucose: NEGATIVE
pH: 5.5 (ref 5.0–8.0)

## 2015-02-15 LAB — CBC
HEMATOCRIT: 47.5 % (ref 39.0–52.0)
Hemoglobin: 14.3 g/dL (ref 13.0–17.0)
MCHC: 30.1 g/dL (ref 30.0–36.0)
MCV: 73.8 fl — AB (ref 78.0–100.0)
Platelets: 302 10*3/uL (ref 150.0–400.0)
RBC: 6.44 Mil/uL — AB (ref 4.22–5.81)
RDW: 14 % (ref 11.5–15.5)
WBC: 5.6 10*3/uL (ref 4.0–10.5)

## 2015-02-15 LAB — COMPREHENSIVE METABOLIC PANEL
ALBUMIN: 4.6 g/dL (ref 3.5–5.2)
ALT: 12 U/L (ref 0–53)
AST: 13 U/L (ref 0–37)
Alkaline Phosphatase: 19 U/L — ABNORMAL LOW (ref 39–117)
BILIRUBIN TOTAL: 0.4 mg/dL (ref 0.2–1.2)
BUN: 13 mg/dL (ref 6–23)
CHLORIDE: 103 meq/L (ref 96–112)
CO2: 33 meq/L — AB (ref 19–32)
CREATININE: 1.14 mg/dL (ref 0.40–1.50)
Calcium: 10 mg/dL (ref 8.4–10.5)
GFR: 82.62 mL/min (ref >60.00)
Glucose, Bld: 94 mg/dL (ref 70–99)
POTASSIUM: 4.3 meq/L (ref 3.5–5.1)
SODIUM: 141 meq/L (ref 135–145)
Total Protein: 7.3 g/dL (ref 6.0–8.3)

## 2015-02-15 NOTE — Progress Notes (Signed)
Pre visit review using our clinic review tool, if applicable. No additional management support is needed unless otherwise documented below in the visit note. 

## 2015-02-15 NOTE — Patient Instructions (Signed)

## 2015-02-16 LAB — URINE CULTURE
Colony Count: NO GROWTH
Organism ID, Bacteria: NO GROWTH

## 2015-02-20 ENCOUNTER — Encounter: Payer: Self-pay | Admitting: Family Medicine

## 2015-02-20 DIAGNOSIS — E782 Mixed hyperlipidemia: Secondary | ICD-10-CM | POA: Insufficient documentation

## 2015-02-20 HISTORY — DX: Mixed hyperlipidemia: E78.2

## 2015-02-20 NOTE — Assessment & Plan Note (Signed)
Tolerating statin, encouraged heart healthy diet, avoid trans fats, minimize simple carbs and saturated fats. Increase exercise as tolerated 

## 2015-02-20 NOTE — Progress Notes (Signed)
Patient ID: Dean Roberts, male   DOB: [birth year], 25 y.o.   MRN: [id number]   Subjective:    Patient ID: Dean Roberts, male    DOB: February 28, 1990, 25 y.o.   MRN: [id number]  Chief Complaint  Patient presents with  . Annual Exam    HPI Patient is in today for annual exam. He is doing well and in his usual state of health. He had a quick episode of chest wall pain yesterday it resolved with movement. No associated symptoms and no history of any similar pain. No trauma or falls. His skin is improved. No new or acute complaints otherwise. Denies palp/SOB/HA/congestion/fevers/GI or GU c/o. Taking meds as prescribed  Past Medical History  Diagnosis Date  . Asthma     childhood, exposed to volcanic dust as infant  . Tinea versicolor 01/25/2013  . Hyperlipidemia, mixed 02/20/2015    Past Surgical History  Procedure Laterality Date  . Hand surgery  2010    right hand, fractured twice, 5 th carpal with pin in bone    Family History  Problem Relation Age of Onset  . Hypertension Maternal Grandfather   . Cancer Paternal Grandmother     ovarian    Social History   Social History  . Marital Status: Single    Spouse Name: N/A  . Number of Children: N/A  . Years of Education: N/A   Occupational History  . Not on file.   Social History Main Topics  . Smoking status: Current Some Day Smoker  . Smokeless tobacco: Never Used  . Alcohol Use: Yes     Comment: occasionally  . Drug Use: No  . Sexual Activity: Not Currently     Comment: lives with roommate, no dietary restrictions, works at [employer]   Other Topics Concern  . Not on file   Social History Narrative    Outpatient Prescriptions Prior to Visit  Medication Sig Dispense Refill  . cetirizine (ZYRTEC) 10 MG tablet Take 1 tablet (10 mg total) by mouth daily as needed for allergies. (Patient not taking: Reported on 02/15/2015) 30 tablet 11  . ketoconazole (NIZORAL) 2 % shampoo Apply 1 application topically once a  week. Weekly prn rash (Patient not taking: Reported on 02/15/2015) 120 mL 2   No facility-administered medications prior to visit.    No Known Allergies  Review of Systems  Constitutional: Negative for fever, chills and malaise/fatigue.  HENT: Negative for congestion and hearing loss.   Eyes: Negative for discharge.  Respiratory: Negative for cough, sputum production and shortness of breath.   Cardiovascular: Negative for chest pain, palpitations and leg swelling.  Gastrointestinal: Negative for heartburn, nausea, vomiting, abdominal pain, diarrhea, constipation and blood in stool.  Genitourinary: Negative for dysuria, urgency, frequency and hematuria.  Musculoskeletal: Negative for myalgias, back pain and falls.  Skin: Negative for rash.  Neurological: Negative for dizziness, sensory change, loss of consciousness, weakness and headaches.  Endo/Heme/Allergies: Negative for environmental allergies. Does not bruise/bleed easily.  Psychiatric/Behavioral: Negative for depression and suicidal ideas. The patient is not nervous/anxious and does not have insomnia.        Objective:    Physical Exam  Constitutional: He is oriented to person, place, and time. He appears well-developed and well-nourished. No distress.  HENT:  Head: Normocephalic and atraumatic.  Eyes: Conjunctivae are normal.  Neck: Neck supple. No thyromegaly present.  Cardiovascular: Normal rate, regular rhythm and normal heart sounds.   No murmur heard. Pulmonary/Chest: Effort normal and breath sounds normal.  No respiratory distress. He has no wheezes.  Abdominal: Soft. Bowel sounds are normal. He exhibits no mass. There is no tenderness.  Musculoskeletal: He exhibits no edema.  Lymphadenopathy:    He has no cervical adenopathy.  Neurological: He is alert and oriented to person, place, and time.  Skin: Skin is warm and dry.  Psychiatric: He has a normal mood and affect. His behavior is normal.    BP 113/66 mmHg   Pulse 62  Temp(Src) 98.2 F (36.8 C) (Oral)  Ht  (1.676 m)  Wt 160 lb 4 oz (72.689 kg)  BMI 25.88 kg/m2  SpO2 100% Wt Readings from Last 3 Encounters:  02/15/15 160 lb 4 oz (72.689 kg)  02/12/14 161 lb 6.4 oz (73.211 kg)  01/22/13 151 lb 1.9 oz (68.548 kg)     Lab Results  Component Value Date   WBC 5.6 02/15/2015   HGB 14.3 02/15/2015   HCT 47.5 02/15/2015   PLT 302.0 02/15/2015   GLUCOSE 94 02/15/2015   CHOL 169 02/15/2015   TRIG 161.0* 02/15/2015   HDL 47.70 02/15/2015   LDLCALC 89 02/15/2015   ALT 12 02/15/2015   AST 13 02/15/2015   NA 141 02/15/2015   K 4.3 02/15/2015   CL 103 02/15/2015   CREATININE 1.14 02/15/2015   BUN 13 02/15/2015   CO2 33* 02/15/2015   TSH 0.93 02/15/2015    Lab Results  Component Value Date   TSH 0.93 02/15/2015   Lab Results  Component Value Date   WBC 5.6 02/15/2015   HGB 14.3 02/15/2015   HCT 47.5 02/15/2015   MCV 73.8* 02/15/2015   PLT 302.0 02/15/2015   Lab Results  Component Value Date   NA 141 02/15/2015   K 4.3 02/15/2015   CO2 33* 02/15/2015   GLUCOSE 94 02/15/2015   BUN 13 02/15/2015   CREATININE 1.14 02/15/2015   BILITOT 0.4 02/15/2015   ALKPHOS 19* 02/15/2015   AST 13 02/15/2015   ALT 12 02/15/2015   PROT 7.3 02/15/2015   ALBUMIN 4.6 02/15/2015   CALCIUM 10.0 02/15/2015   GFR 82.62 02/15/2015   Lab Results  Component Value Date   CHOL 169 02/15/2015   Lab Results  Component Value Date   HDL 47.70 02/15/2015   Lab Results  Component Value Date   LDLCALC 89 02/15/2015   Lab Results  Component Value Date   TRIG 161.0* 02/15/2015   Lab Results  Component Value Date   CHOLHDL 4 02/15/2015   No results found for: HGBA1C     Assessment & Plan:   Problem List Items Addressed This Visit    Hyperlipidemia, mixed    Tolerating statin, encouraged heart healthy diet, avoid trans fats, minimize simple carbs and saturated fats. Increase exercise as tolerated      Preventative health care     Patient encouraged to maintain heart healthy diet, regular exercise, adequate sleep. Consider daily probiotics. Take medications as prescribed. Labs ordered and reviewed      Relevant Orders   TSH (Completed)   CBC (Completed)   Comprehensive metabolic panel (Completed)   Lipid panel (Completed)    Other Visit Diagnoses    Encounter for immunization    -  Primary    Urinary frequency        Relevant Orders    Urinalysis (Completed)    Urine culture (Completed)       I have discontinued Mr. Ahr ketoconazole. I am also having him maintain his cetirizine.  No orders of the defined types were placed in this encounter.     Penni Homans, MD

## 2015-02-20 NOTE — Assessment & Plan Note (Signed)
Patient encouraged to maintain heart healthy diet, regular exercise, adequate sleep. Consider daily probiotics. Take medications as prescribed. Labs ordered and reviewed 

## 2016-02-17 ENCOUNTER — Encounter: Payer: 59 | Admitting: Family Medicine

## 2016-06-07 ENCOUNTER — Encounter: Payer: Self-pay | Admitting: Family Medicine

## 2016-06-07 ENCOUNTER — Ambulatory Visit (INDEPENDENT_AMBULATORY_CARE_PROVIDER_SITE_OTHER): Payer: BLUE CROSS/BLUE SHIELD | Admitting: Family Medicine

## 2016-06-07 VITALS — BP 90/62 | HR 68 | Temp 98.2°F | Resp 18 | Ht 65.5 in | Wt 142.6 lb

## 2016-06-07 DIAGNOSIS — Z7289 Other problems related to lifestyle: Secondary | ICD-10-CM

## 2016-06-07 DIAGNOSIS — E782 Mixed hyperlipidemia: Secondary | ICD-10-CM

## 2016-06-07 DIAGNOSIS — Z0000 Encounter for general adult medical examination without abnormal findings: Secondary | ICD-10-CM

## 2016-06-07 LAB — COMPREHENSIVE METABOLIC PANEL
ALBUMIN: 4.7 g/dL (ref 3.5–5.2)
ALT: 9 U/L (ref 0–53)
AST: 13 U/L (ref 0–37)
Alkaline Phosphatase: 18 U/L — ABNORMAL LOW (ref 39–117)
BILIRUBIN TOTAL: 0.5 mg/dL (ref 0.2–1.2)
BUN: 11 mg/dL (ref 6–23)
CALCIUM: 9.8 mg/dL (ref 8.4–10.5)
CO2: 32 mEq/L (ref 19–32)
CREATININE: 1.16 mg/dL (ref 0.40–1.50)
Chloride: 103 mEq/L (ref 96–112)
GFR: 80.17 mL/min (ref >60.00)
Glucose, Bld: 91 mg/dL (ref 70–99)
Potassium: 4.4 mEq/L (ref 3.5–5.1)
Sodium: 139 mEq/L (ref 135–145)
Total Protein: 6.9 g/dL (ref 6.0–8.3)

## 2016-06-07 LAB — CBC
HCT: 44.2 % (ref 39.0–52.0)
HEMOGLOBIN: 13.5 g/dL (ref 13.0–17.0)
MCHC: 30.6 g/dL (ref 30.0–36.0)
MCV: 74.3 fl — AB (ref 78.0–100.0)
PLATELETS: 288 10*3/uL (ref 150.0–400.0)
RBC: 5.95 Mil/uL — AB (ref 4.22–5.81)
RDW: 12.8 % (ref 11.5–15.5)
WBC: 4.5 10*3/uL (ref 4.0–10.5)

## 2016-06-07 LAB — LIPID PANEL
CHOLESTEROL: 161 mg/dL (ref 0–200)
HDL: 62.2 mg/dL (ref >39.00)
LDL Cholesterol: 82 mg/dL (ref 0–99)
NonHDL: 98.74
TRIGLYCERIDES: 83 mg/dL (ref 0.0–149.0)
Total CHOL/HDL Ratio: 3
VLDL: 16.6 mg/dL (ref 0.0–40.0)

## 2016-06-07 LAB — TSH: TSH: 0.89 u[IU]/mL (ref 0.35–4.50)

## 2016-06-07 NOTE — Patient Instructions (Addendum)
HYDRATE HYDRATE HYDRATE 64 OUNCES OF WATER A DAY     Preventive Care 18-39 Years, Male Preventive care refers to lifestyle choices and visits with your health care provider that can promote health and wellness. What does preventive care include?  A yearly physical exam. This is also called an annual well check.  Dental exams once or twice a year.  Routine eye exams. Ask your health care provider how often you should have your eyes checked.  Personal lifestyle choices, including:  Daily care of your teeth and gums.  Regular physical activity.  Eating a healthy diet.  Avoiding tobacco and drug use.  Limiting alcohol use.  Practicing safe sex. What happens during an annual well check? The services and screenings done by your health care provider during your annual well check will depend on your age, overall health, lifestyle risk factors, and family history of disease. Counseling  Your health care provider may ask you questions about your:  Alcohol use.  Tobacco use.  Drug use.  Emotional well-being.  Home and relationship well-being.  Sexual activity.  Eating habits.  Work and work environment. Screening  You may have the following tests or measurements:  Height, weight, and BMI.  Blood pressure.  Lipid and cholesterol levels. These may be checked every 5 years starting at age 20.  Diabetes screening. This is done by checking your blood sugar (glucose) after you have not eaten for a while (fasting).  Skin check.  Hepatitis C blood test.  Hepatitis B blood test.  Sexually transmitted disease (STD) testing. Discuss your test results, treatment options, and if necessary, the need for more tests with your health care provider. Vaccines  Your health care provider may recommend certain vaccines, such as:  Influenza vaccine. This is recommended every year.  Tetanus, diphtheria, and acellular pertussis (Tdap, Td) vaccine. You may need a Td booster every  10 years.  Varicella vaccine. You may need this if you have not been vaccinated.  HPV vaccine. If you are 26 or younger, you may need three doses over 6 months.  Measles, mumps, and rubella (MMR) vaccine. You may need at least one dose of MMR.You may also need a second dose.  Pneumococcal 13-valent conjugate (PCV13) vaccine. You may need this if you have certain conditions and have not been vaccinated.  Pneumococcal polysaccharide (PPSV23) vaccine. You may need one or two doses if you smoke cigarettes or if you have certain conditions.  Meningococcal vaccine. One dose is recommended if you are age 19-21 years and a first-year college student living in a residence hall, or if you have one of several medical conditions. You may also need additional booster doses.  Hepatitis A vaccine. You may need this if you have certain conditions or if you travel or work in places where you may be exposed to hepatitis A.  Hepatitis B vaccine. You may need this if you have certain conditions or if you travel or work in places where you may be exposed to hepatitis B.  Haemophilus influenzae type b (Hib) vaccine. You may need this if you have certain risk factors. Talk to your health care provider about which screenings and vaccines you need and how often you need them. This information is not intended to replace advice given to you by your health care provider. Make sure you discuss any questions you have with your health care provider. Document Released: 02/27/2001 Document Revised: 09/21/2015 Document Reviewed: 11/02/2014 Elsevier Interactive Patient Education  2017 [employer] Inc[employer]  

## 2016-06-07 NOTE — Assessment & Plan Note (Signed)
Patient encouraged to maintain heart healthy diet, regular exercise, adequate sleep. Consider daily probiotics. Take medications as prescribed 

## 2016-06-07 NOTE — Progress Notes (Signed)
Subjective:  I acted as a Neurosurgeon for Dr. Abner Roberts. Dean Roberts, [place]   Patient ID: Dean Roberts, male    DOB: [birth year], 26 y.o.   MRN: [id number]  Chief Complaint  Patient presents with  . Annual Exam    HPI  Patient is in today for an annual exam. Patient has no acute concerns. No recent febrile illness or acute hospitalizations. Denies CP/palp/SOB/HA/congestion/fevers/GI or GU c/o. Taking meds as prescribed  He continues to work at [employer]. He is maintaining a heart healthy diet and exercises regularly.    Patient Care Team: Dean Canary, MD as PCP - General (Family Medicine)   Past Medical History:  Diagnosis Date  . Asthma    childhood, exposed to volcanic dust as infant  . Hyperlipidemia, mixed 02/20/2015  . Tinea versicolor 01/25/2013    Past Surgical History:  Procedure Laterality Date  . HAND SURGERY  2010   right hand, fractured twice, 5 th carpal with pin in bone    Family History  Problem Relation Age of Onset  . Hypertension Maternal Grandfather   . Cancer Paternal Grandmother        ovarian    Social History   Social History  . Marital status: Single    Spouse name: N/A  . Number of children: N/A  . Years of education: N/A   Occupational History  . Not on file.   Social History Main Topics  . Smoking status: Current Some Day Smoker  . Smokeless tobacco: Never Used  . Alcohol use Yes     Comment: occasionally  . Drug use: No  . Sexual activity: Not Currently     Comment: lives with roommate, no dietary restrictions, works at [employer]   Other Topics Concern  . Not on file   Social History Narrative  . No narrative on file    Outpatient Medications Prior to Visit  Medication Sig Dispense Refill  . cetirizine (ZYRTEC) 10 MG tablet Take 1 tablet (10 mg total) by mouth daily as needed for allergies. 30 tablet 11   No facility-administered medications prior to visit.     No Known Allergies  Review of Systems    Constitutional: Negative for fever and malaise/fatigue.  HENT: Negative for congestion.   Eyes: Negative for blurred vision.  Respiratory: Negative for cough and shortness of breath.   Cardiovascular: Negative for chest pain, palpitations and leg swelling.  Gastrointestinal: Negative for vomiting.  Musculoskeletal: Negative for back pain.  Skin: Negative for rash.  Neurological: Negative for loss of consciousness and headaches.       Objective:    Physical Exam  Constitutional: He is oriented to person, place, and time. He appears well-developed and well-nourished. No distress.  HENT:  Head: Normocephalic and atraumatic.  Eyes: Conjunctivae are normal.  Neck: Normal range of motion. No thyromegaly present.  Cardiovascular: Normal rate and regular rhythm.   Pulmonary/Chest: Effort normal and breath sounds normal. He has no wheezes.  Abdominal: Soft. Bowel sounds are normal. He exhibits no mass. There is no tenderness. There is no rebound and no guarding.  Musculoskeletal: Normal range of motion. He exhibits no edema or deformity.  Neurological: He is alert and oriented to person, place, and time.  Skin: Skin is warm and dry. He is not diaphoretic.  Psychiatric: He has a normal mood and affect.    BP 90/62 (BP Location: Left Arm, Patient Position: Sitting, Cuff Size: Normal)   Pulse 68   Temp  98.2 F (36.8 C) (Oral)   Resp 18   Ht 5' 5.5" (1.664 m)   Wt 142 lb 9.6 oz (64.7 kg)   SpO2 98%   BMI 23.37 kg/m  Wt Readings from Last 3 Encounters:  06/07/16 142 lb 9.6 oz (64.7 kg)  02/15/15 160 lb 4 oz (72.7 kg)  02/12/14 161 lb 6.4 oz (73.2 kg)   BP Readings from Last 3 Encounters:  06/07/16 90/62  02/15/15 113/66  02/12/14 116/75     Immunization History  Administered Date(s) Administered  . Influenza Whole 12/13/2012  . Influenza,inj,Quad PF,36+ Mos 02/12/2014, 02/15/2015  . Td 01/16/2007    Health Maintenance  Topic Date Due  . HIV Screening  04/04/2004  .  INFLUENZA VACCINE  08/15/2016  . TETANUS/TDAP  01/15/2017    Lab Results  Component Value Date   WBC 5.6 02/15/2015   HGB 14.3 02/15/2015   HCT 47.5 02/15/2015   PLT 302.0 02/15/2015   GLUCOSE 94 02/15/2015   CHOL 169 02/15/2015   TRIG 161.0 (H) 02/15/2015   HDL 47.70 02/15/2015   LDLCALC 89 02/15/2015   ALT 12 02/15/2015   AST 13 02/15/2015   NA 141 02/15/2015   K 4.3 02/15/2015   CL 103 02/15/2015   CREATININE 1.14 02/15/2015   BUN 13 02/15/2015   CO2 33 (H) 02/15/2015   TSH 0.93 02/15/2015    Lab Results  Component Value Date   TSH 0.93 02/15/2015   Lab Results  Component Value Date   WBC 5.6 02/15/2015   HGB 14.3 02/15/2015   HCT 47.5 02/15/2015   MCV 73.8 (L) 02/15/2015   PLT 302.0 02/15/2015   Lab Results  Component Value Date   NA 141 02/15/2015   K 4.3 02/15/2015   CO2 33 (H) 02/15/2015   GLUCOSE 94 02/15/2015   BUN 13 02/15/2015   CREATININE 1.14 02/15/2015   BILITOT 0.4 02/15/2015   ALKPHOS 19 (L) 02/15/2015   AST 13 02/15/2015   ALT 12 02/15/2015   PROT 7.3 02/15/2015   ALBUMIN 4.6 02/15/2015   CALCIUM 10.0 02/15/2015   GFR 82.62 02/15/2015   Lab Results  Component Value Date   CHOL 169 02/15/2015   Lab Results  Component Value Date   HDL 47.70 02/15/2015   Lab Results  Component Value Date   LDLCALC 89 02/15/2015   Lab Results  Component Value Date   TRIG 161.0 (H) 02/15/2015   Lab Results  Component Value Date   CHOLHDL 4 02/15/2015   No results found for: HGBA1C       Assessment & Plan:   Problem List Items Addressed This Visit    Preventative health care - Primary    Patient encouraged to maintain heart healthy diet, regular exercise, adequate sleep. Consider daily probiotics. Take medications as prescribed      Relevant Orders   CBC   Comprehensive metabolic panel   Lipid panel   TSH   Hyperlipidemia, mixed    Encouraged heart healthy diet, increase exercise, avoid trans fats, consider a krill oil cap daily        Relevant Orders   Lipid panel    Other Visit Diagnoses    Other problems related to lifestyle       Relevant Orders   HIV antibody      I am having Dean Roberts maintain his cetirizine.  No orders of the defined types were placed in this encounter.   CMA served as Neurosurgeonscribe during this visit.  History, Physical and Plan performed by medical provider. Documentation and orders reviewed and attested to.  Penni Homans, MD

## 2016-06-07 NOTE — Assessment & Plan Note (Signed)
Encouraged heart healthy diet, increase exercise, avoid trans fats, consider a krill oil cap daily 

## 2016-06-08 LAB — HIV ANTIBODY (ROUTINE TESTING W REFLEX): HIV 1&2 Ab, 4th Generation: NONREACTIVE

## 2017-06-13 ENCOUNTER — Encounter: Payer: Self-pay | Admitting: Family Medicine

## 2017-06-13 ENCOUNTER — Encounter

## 2018-10-25 MED FILL — FLUARIX QUADRIVALENT 0.5 ML: 0.5 | 1 days supply | Qty: 1 | Fill #0
